# Patient Record
Sex: Female | Born: 2000 | Race: White | Hispanic: No | Marital: Single
Health system: Southern US, Community
[De-identification: ages and names within clinical notes are randomized; demographics above are authoritative.]

## PROBLEM LIST (undated history)

## (undated) ENCOUNTER — Ambulatory Visit

## (undated) HISTORY — PX: NO PAST SURGERIES: SHX2092

---
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
## 2021-05-11 IMAGING — DX DG ANKLE COMPLETE 3+V*L*
3 series · 3 of 3 positions shown · non-contrast
Comparison: None.

CLINICAL DATA: Running, fall, pain in left ankle and foot

EXAM:
LEFT ANKLE COMPLETE - 3+ VIEW

[ankle ap]
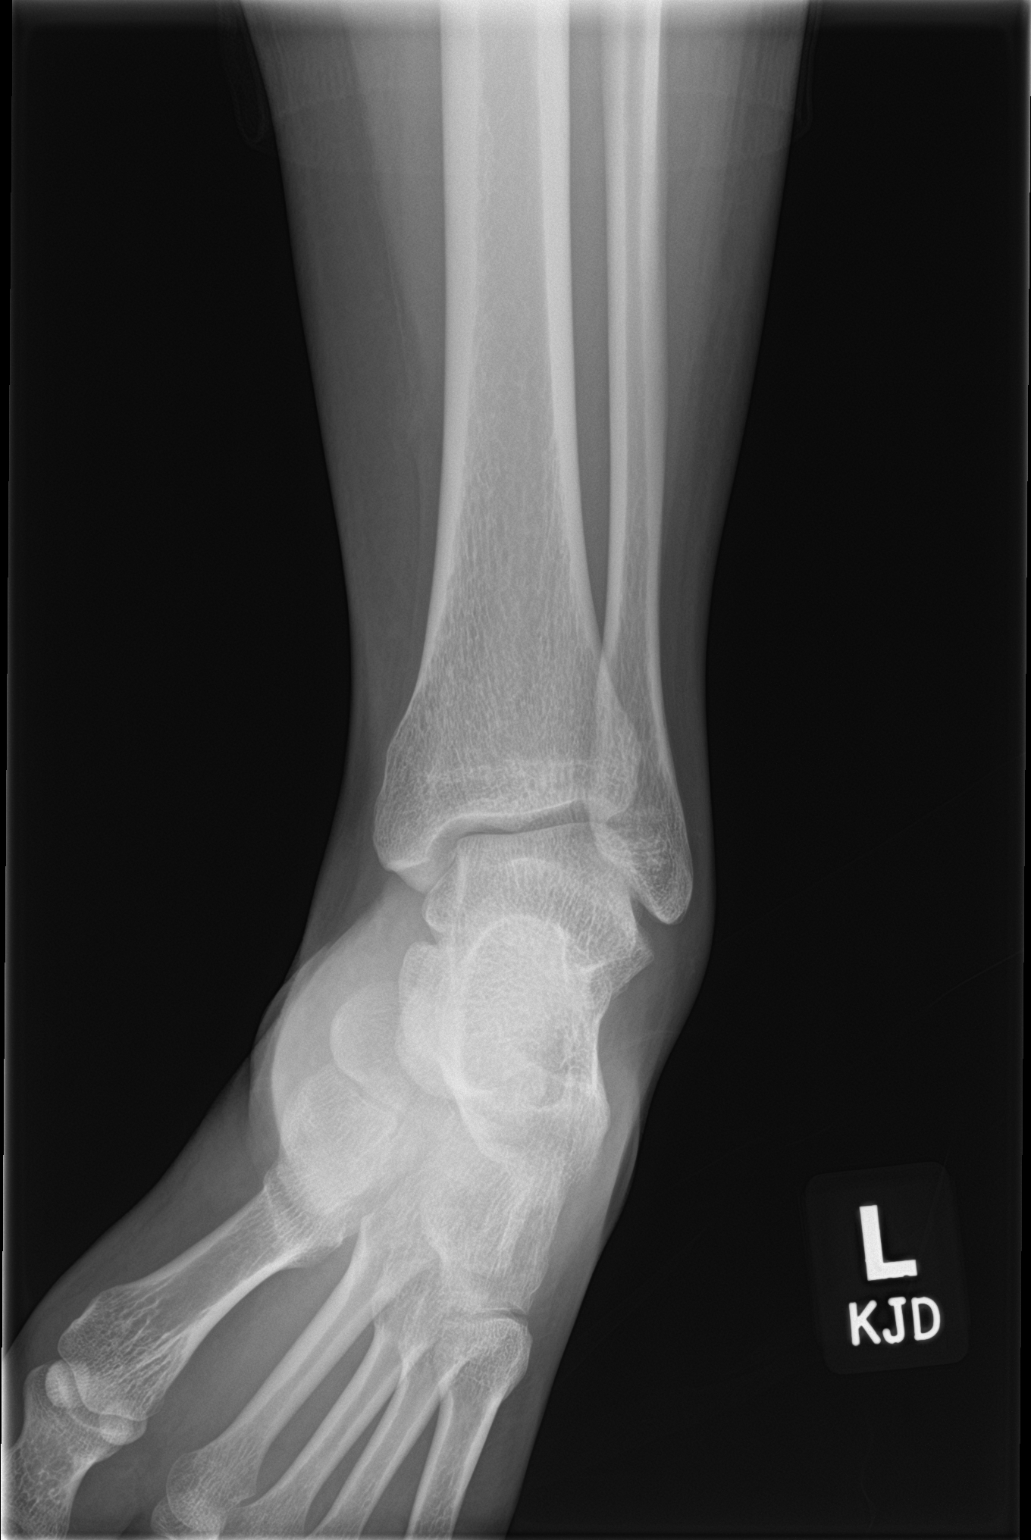

[ankle obl]
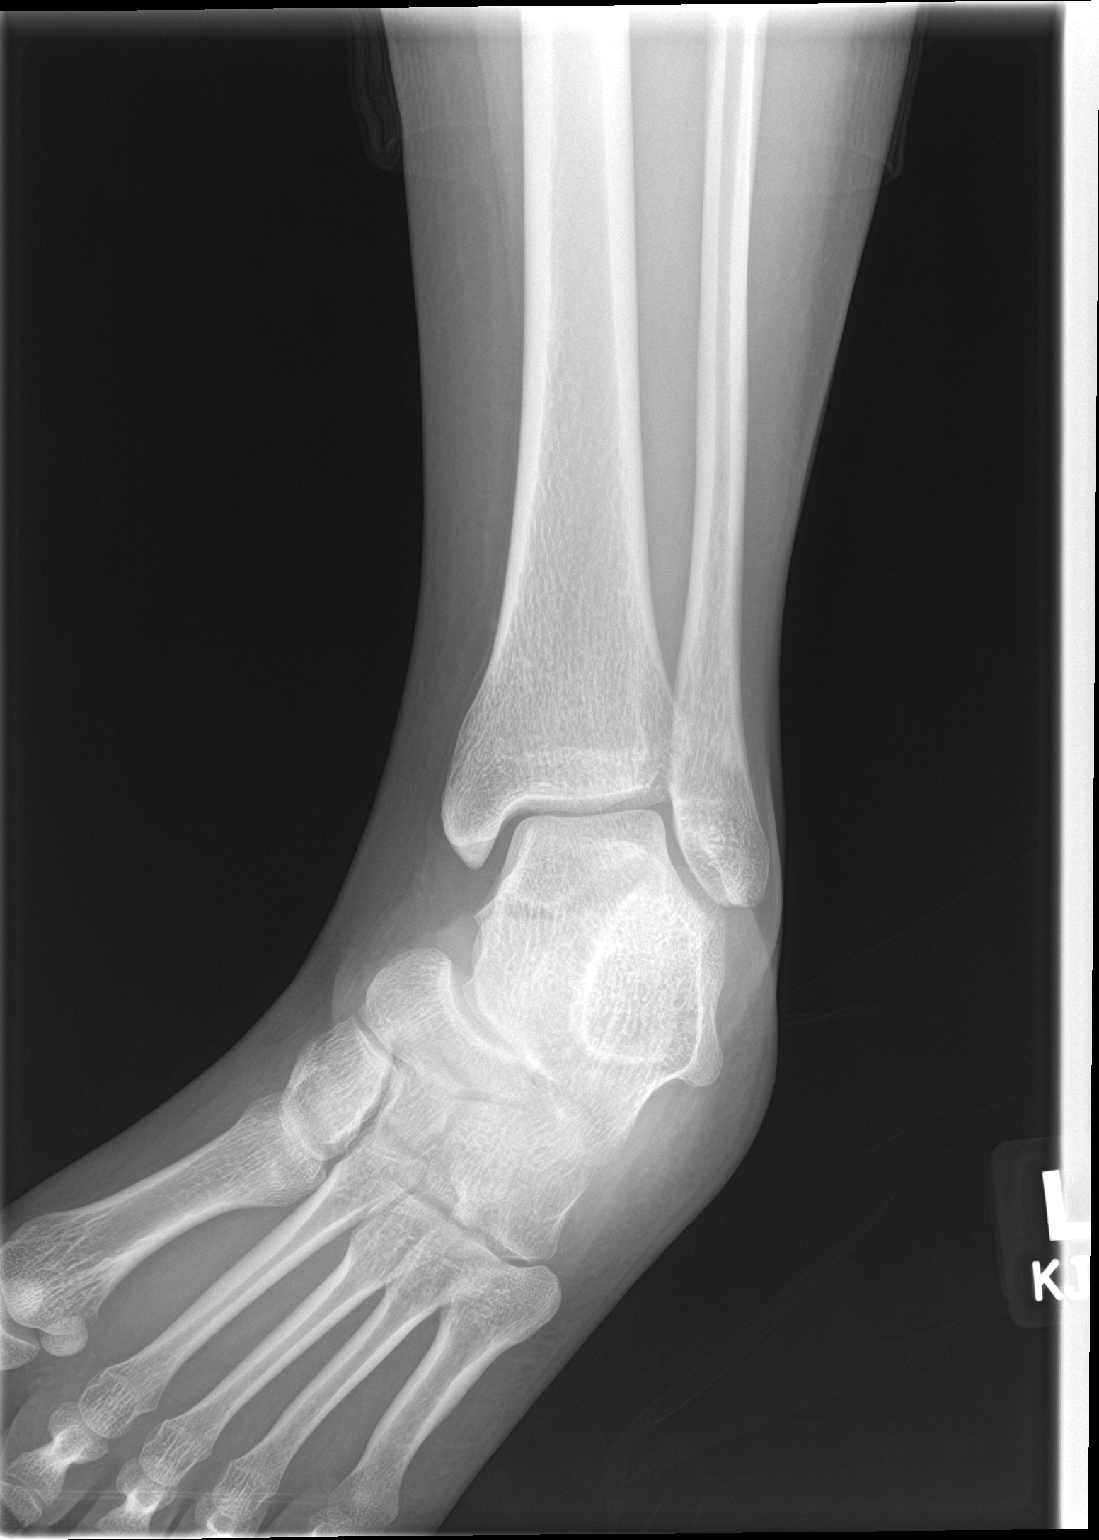

[ankle lat]
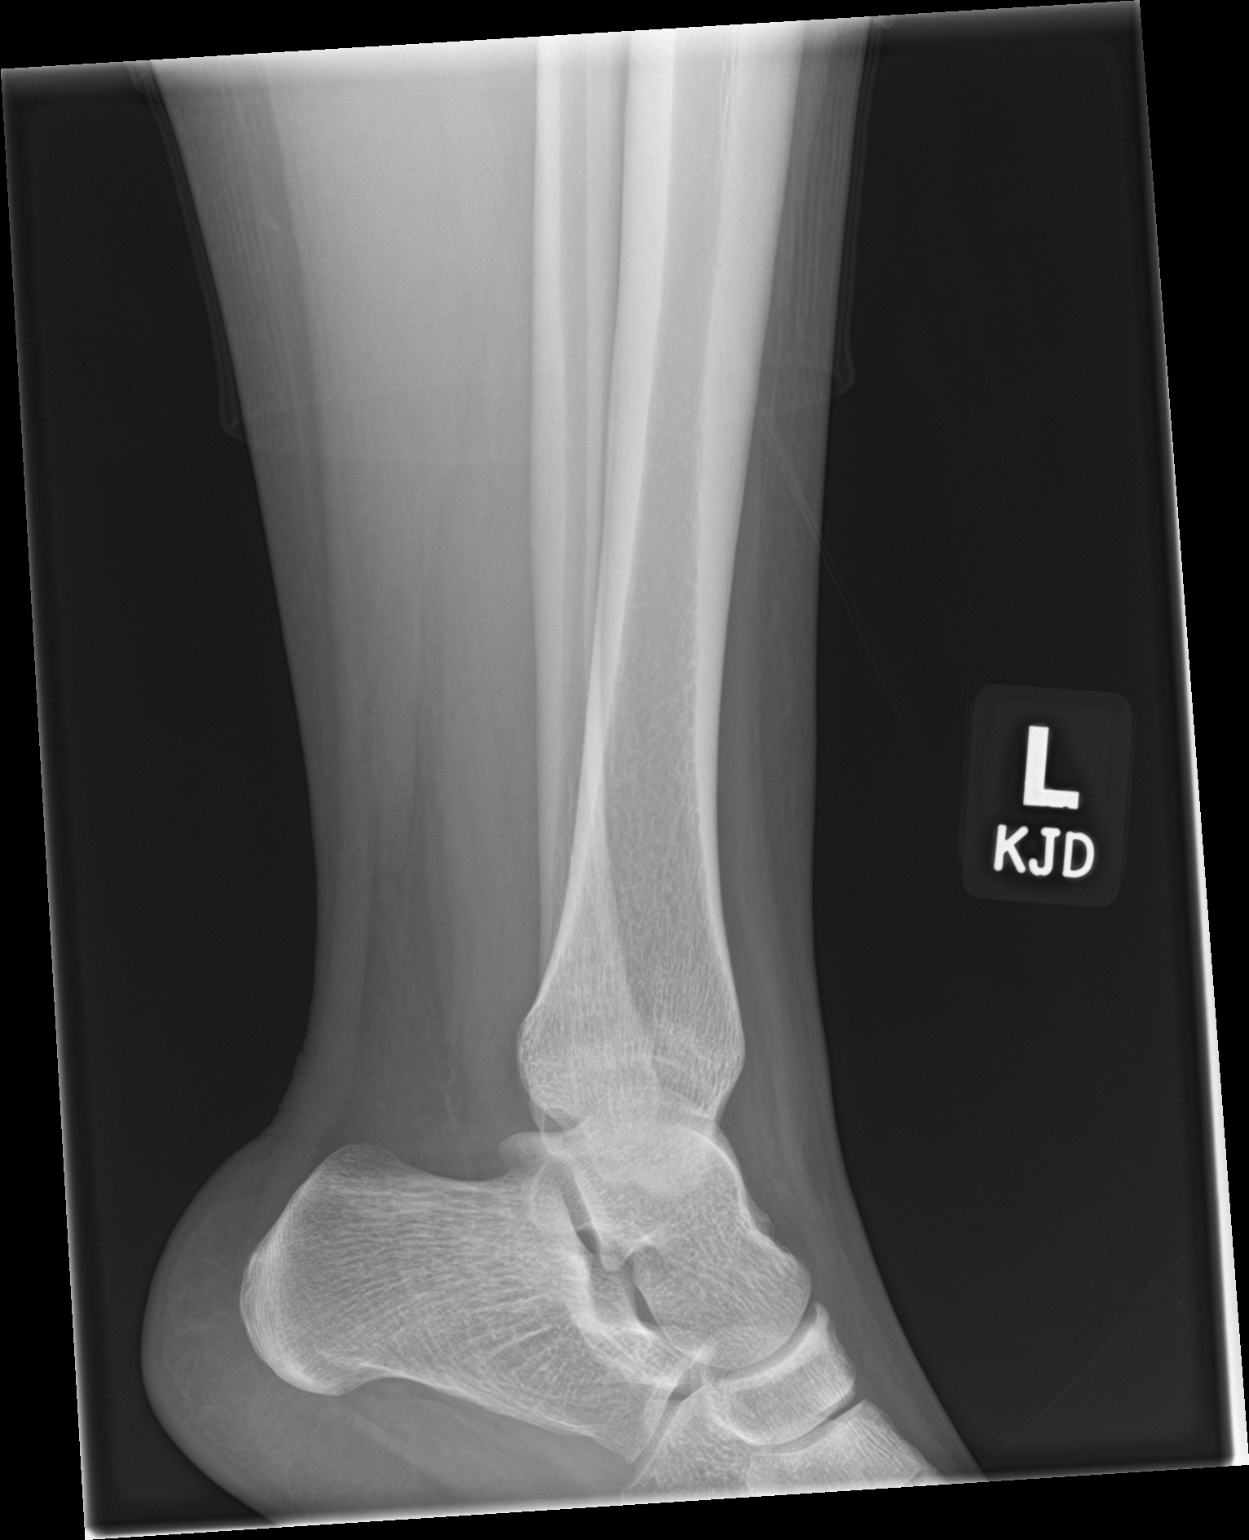

[3 of 3 positions shown; findings below may reference images not displayed]

FINDINGS: There is no evidence of fracture, dislocation, or joint effusion.
There is no evidence of arthropathy or other focal bone abnormality.
Soft tissues are unremarkable.
IMPRESSION: Negative.

## 2022-11-30 ENCOUNTER — Ambulatory Visit: Payer: Medicaid Other | Admitting: Family

## 2022-12-02 ENCOUNTER — Ambulatory Visit: Payer: Medicaid Other | Admitting: Family

## 2022-12-02 ENCOUNTER — Other Ambulatory Visit (HOSPITAL_COMMUNITY)
Admission: RE | Admit: 2022-12-02 | Discharge: 2022-12-02 | Disposition: A | Discharge status: Home / Self Care | Payer: Medicaid Other | Source: Ambulatory Visit | Attending: Family | Admitting: Family

## 2022-12-02 ENCOUNTER — Encounter: Payer: Self-pay | Admitting: Family

## 2022-12-02 VITALS — BP 123/85 | HR 80 | Ht 61.25 in | Wt 221.0 lb

## 2022-12-02 DIAGNOSIS — Z113 Encounter for screening for infections with a predominantly sexual mode of transmission: Secondary | ICD-10-CM | POA: Insufficient documentation

## 2022-12-02 DIAGNOSIS — Z3202 Encounter for pregnancy test, result negative: Secondary | ICD-10-CM | POA: Diagnosis not present

## 2022-12-02 DIAGNOSIS — Z975 Presence of (intrauterine) contraceptive device: Secondary | ICD-10-CM | POA: Diagnosis not present

## 2022-12-02 DIAGNOSIS — R635 Abnormal weight gain: Secondary | ICD-10-CM | POA: Diagnosis not present

## 2022-12-02 LAB — POCT URINE PREGNANCY: Preg Test, Ur: NEGATIVE

## 2022-12-02 NOTE — Progress Notes (Signed)
History was provided by the patient and mother.  Natalie Austin is a 22 y.o. female who is here for nexplanon.   PCP confirmed? Yes.    Salvatore Decent, PA-C   Plan from last visit:  Removal and reinsertion on 11/26/2019  HPI:   -doesn't eat veggies or anything green  -since Nexplanon gained weight; wakes up really hungry  -snacks a little and eats 2 meals daily  -water mostly -is supposed to take vitamin D and multivitamin daily  -was told she is close to prediabetic range; asking about weight and birth control  -would like to keep Nexplanon longer since it may still be effective  -no pain with intercourse, no vaginal discharge changes, no unexplained bleeding; no pelvic or abdominal pain   -mom asking about PAP smear when due  -periods have regulated now on Nexplanon; at first did not have one and now having a period every month  -precontemplative for pills instead of implant; has had implant since she was 17 and notes weight gain since that time  Patient Active Problem List   Diagnosis Date Noted   Abnormal uterine bleeding 11/26/2019   Encounter for surveillance of Nexplanon subdermal contraceptive 01/10/2017    Current Outpatient Medications on File Prior to Visit  Medication Sig Dispense Refill   ibuprofen (ADVIL,MOTRIN) 600 MG tablet Take 1 tablet (600 mg total) by mouth every 6 (six) hours as needed for mild pain or moderate pain. 30 tablet 0   No current facility-administered medications on file prior to visit.    No Known Allergies  Physical Exam:    Vitals:   12/02/22 1053  BP: 123/85  Pulse: 80  Weight: 221 lb (100.2 kg)  Height: 5' 1.25" (1.556 m)   Wt Readings from Last 3 Encounters:  12/02/22 221 lb (100.2 kg)  11/26/19 193 lb (87.5 kg) (97%, Z= 1.87)*  12/01/18 177 lb (80.3 kg) (95%, Z= 1.65)*   * Growth percentiles are based on CDC (Girls, 2-20 Years) data.     Growth %ile SmartLinks can only be used for patients less than 33 years old. No LMP  recorded. Patient has had an implant.  Physical Exam Vitals and nursing note reviewed.  Constitutional:      General: She is not in acute distress.    Appearance: She is well-developed.  Neck:     Thyroid: No thyromegaly.  Cardiovascular:     Rate and Rhythm: Normal rate and regular rhythm.     Heart sounds: No murmur heard. Pulmonary:     Breath sounds: Normal breath sounds.  Musculoskeletal:     Right lower leg: No edema.     Left lower leg: No edema.  Lymphadenopathy:     Cervical: No cervical adenopathy.  Skin:    General: Skin is warm.     Findings: No rash.  Neurological:     General: No focal deficit present.     Mental Status: She is alert and oriented to person, place, and time.     Motor: No tremor.     Comments: No tremor  Psychiatric:        Attention and Perception: Attention normal.        Mood and Affect: Mood normal.        Speech: Speech normal.        Behavior: Behavior normal.      Assessment/Plan:  1. Weight gain 2. Nexplanon in place  -approx 21 lb weight gain since 2021; reviewed that Nexplanon is effective  for up to 5 years now (showed her the recommendations that still remain on Nexplanon website, studies showing longer efficacy, and other recommendations for three vs five year efficacy)  -she is to follow-up with Salvatore Decent, PA in January - discussed option for [facility] for additional support  -I do not see any recent blood work, however she endorsed having done recently; consideration to rule out thyroid, metabolic reasons for weight gain and changes in bleeding pattern  -discussed that we can change out Nexplanon today, change method, or she can continue with current method; she would like to continue at this time with plan for follow-up in 6 months to continue conversation   3. Routine screening for STI (sexually transmitted infection) - Urine cytology ancillary only  4. Pregnancy examination or test, negative result -  POCT urine pregnancy

## 2022-12-03 LAB — URINE CYTOLOGY ANCILLARY ONLY
Bacterial Vaginitis-Urine: POSITIVE — AB
Candida Urine: NEGATIVE
Chlamydia: NEGATIVE
Comment: NEGATIVE
Comment: NEGATIVE
Comment: NORMAL
Neisseria Gonorrhea: NEGATIVE
Trichomonas: NEGATIVE

## 2023-04-07 ENCOUNTER — Encounter: Payer: Medicaid Other | Admitting: Family

## 2023-04-11 ENCOUNTER — Encounter: Payer: Self-pay | Admitting: Family

## 2023-04-11 ENCOUNTER — Ambulatory Visit (INDEPENDENT_AMBULATORY_CARE_PROVIDER_SITE_OTHER): Payer: Medicaid Other | Admitting: Family

## 2023-04-11 VITALS — BP 126/76 | HR 81 | Ht 61.12 in | Wt 218.0 lb

## 2023-04-11 DIAGNOSIS — Z113 Encounter for screening for infections with a predominantly sexual mode of transmission: Secondary | ICD-10-CM

## 2023-04-11 DIAGNOSIS — N921 Excessive and frequent menstruation with irregular cycle: Secondary | ICD-10-CM | POA: Diagnosis not present

## 2023-04-11 DIAGNOSIS — Z975 Presence of (intrauterine) contraceptive device: Secondary | ICD-10-CM

## 2023-04-11 DIAGNOSIS — Z3202 Encounter for pregnancy test, result negative: Secondary | ICD-10-CM

## 2023-04-11 LAB — POCT URINE PREGNANCY: Preg Test, Ur: NEGATIVE

## 2023-04-11 NOTE — Progress Notes (Signed)
 History was provided by the patient and mother.  Natalie Austin is a 23 y.o. female who is here for Nexplanon in place.   PCP confirmed? Yes.    Kathreen Cornfield, PA-C  Plan from last visit:  1. Weight gain 2. Nexplanon in place   -approx 21 lb weight gain since 2021; reviewed that Nexplanon is effective for up to 5 years now (showed her the recommendations that still remain on Nexplanon website, studies showing longer efficacy, and other recommendations for three vs five year efficacy)  -she is to follow-up with Salvatore Decent, PA in January - discussed option for [facility] for additional support  -I do not see any recent blood work, however she endorsed having done recently; consideration to rule out thyroid, metabolic reasons for weight gain and changes in bleeding pattern  -discussed that we can change out Nexplanon today, change method, or she can continue with current method; she would like to continue at this time with plan for follow-up in 6 months to continue conversation    3. Routine screening for STI (sexually transmitted infection) - Urine cytology ancillary only   4. Pregnancy examination or test, negative result   HPI:   -nexplanon in place  -did have period but only lasted a week, started 1/26 and on through 1/31; then started spotting 2/8 -2/12 (wiping and pantyliner only)  -no fishy odor; started taking pills by [facility] because every time she comes here she is told she has BV; pHD wash  -mom says she drinks cranberry juice like water     Patient Active Problem List   Diagnosis Date Noted   Abnormal uterine bleeding 11/26/2019   Encounter for surveillance of Nexplanon subdermal contraceptive 01/10/2017    Current Outpatient Medications on File Prior to Visit  Medication Sig Dispense Refill   ibuprofen (ADVIL,MOTRIN) 600 MG tablet Take 1 tablet (600 mg total) by mouth every 6 (six) hours as needed for mild pain or moderate pain. 30 tablet  0   No current facility-administered medications on file prior to visit.    No Known Allergies  Physical Exam:    Vitals:   04/11/23 1021  BP: 126/76  Pulse: 81  Weight: 218 lb (98.9 kg)  Height: 5' 1.12" (1.552 m)   Wt Readings from Last 3 Encounters:  04/11/23 218 lb (98.9 kg)  12/02/22 221 lb (100.2 kg)  11/26/19 193 lb (87.5 kg) (97%, Z= 1.87)*   * Growth percentiles are based on CDC (Girls, 2-20 Years) data.    Growth %ile SmartLinks can only be used for patients less than 16 years old. No LMP recorded. Patient has had an implant.  Physical Exam Constitutional:      General: She is not in acute distress.    Appearance: She is well-developed.  HENT:     Head: Normocephalic and atraumatic.  Eyes:     General: No scleral icterus.    Pupils: Pupils are equal, round, and reactive to light.  Neck:     Thyroid: No thyromegaly.  Cardiovascular:     Rate and Rhythm: Normal rate and regular rhythm.     Heart sounds: Normal heart sounds. No murmur heard. Pulmonary:     Effort: Pulmonary effort is normal.     Breath sounds: Normal breath sounds.  Musculoskeletal:        General: Normal range of motion.     Cervical back: Normal range of motion and neck supple.  Lymphadenopathy:  Cervical: No cervical adenopathy.  Skin:    General: Skin is warm and dry.     Findings: No rash.     Comments: Implant in place LUE   Neurological:     Mental Status: She is alert and oriented to person, place, and time.     Cranial Nerves: No cranial nerve deficit.  Psychiatric:        Behavior: Behavior normal.        Thought Content: Thought content normal.        Judgment: Judgment normal.      Assessment/Plan:  1. Breakthrough bleeding on Nexplanon (Primary) 2. Nexplanon in place -discussed PAP starts at 22, with PAP + HPV testing at 23 yo -referral to OB/GYN as she will age out of practice next month at birthday  -she elects to return tomorrow for removal/replacement for  Nexplanon  -discussed causes of BV and symptoms; advised that symptoms may return briefly after stopping OTC products for pH balance but should improve over time as pH balance restores; does not have symptoms at this time - Ambulatory referral to Obstetrics / Gynecology  3. Routine screening for STI (sexually transmitted infection) - Urine cytology ancillary only  4. Pregnancy examination or test, negative result - POCT urine pregnancy

## 2023-04-12 ENCOUNTER — Encounter: Payer: Self-pay | Admitting: Family

## 2023-04-12 ENCOUNTER — Ambulatory Visit (INDEPENDENT_AMBULATORY_CARE_PROVIDER_SITE_OTHER): Payer: Self-pay | Admitting: Family

## 2023-04-12 ENCOUNTER — Other Ambulatory Visit (HOSPITAL_COMMUNITY)
Admission: RE | Admit: 2023-04-12 | Discharge: 2023-04-12 | Disposition: A | Discharge status: Home / Self Care | Payer: Medicaid Other | Source: Ambulatory Visit | Attending: Family | Admitting: Family

## 2023-04-12 VITALS — BP 115/76 | HR 80 | Ht 61.12 in | Wt 218.6 lb

## 2023-04-12 DIAGNOSIS — Z113 Encounter for screening for infections with a predominantly sexual mode of transmission: Secondary | ICD-10-CM | POA: Diagnosis present

## 2023-04-12 DIAGNOSIS — Z3046 Encounter for surveillance of implantable subdermal contraceptive: Secondary | ICD-10-CM

## 2023-04-12 DIAGNOSIS — N921 Excessive and frequent menstruation with irregular cycle: Secondary | ICD-10-CM | POA: Diagnosis not present

## 2023-04-12 DIAGNOSIS — Z975 Presence of (intrauterine) contraceptive device: Secondary | ICD-10-CM

## 2023-04-12 MED ORDER — ETONOGESTREL 68 MG ~~LOC~~ IMPL
68.0000 mg | DRUG_IMPLANT | Freq: Once | SUBCUTANEOUS | Status: AC
  Administered 2023-04-12: 68 mg via SUBCUTANEOUS

## 2023-04-12 NOTE — Progress Notes (Signed)
 History was provided by the patient and mother.  Natalie Austin is a 23 y.o. female who is here for nexplanon removal and reinsertion.   PCP confirmed? Yes.    Kathreen Cornfield, PA-C  Plan from last visit:  1. Breakthrough bleeding on Nexplanon (Primary) 2. Nexplanon in place -discussed PAP starts at 22, with PAP + HPV testing at 23 yo -referral to OB/GYN as she will age out of practice next month at birthday  -she elects to return tomorrow for removal/replacement for Nexplanon  -discussed causes of BV and symptoms; advised that symptoms may return briefly after stopping OTC products for pH balance but should improve over time as pH balance restores; does not have symptoms at this time - Ambulatory referral to Obstetrics / Gynecology   3. Routine screening for STI (sexually transmitted infection) - Urine cytology ancillary only   4. Pregnancy examination or test, negative result - POCT urine pregnancy     HPI:   -returns for removal and reinsertion  -no concerns prior to procedure  Patient Active Problem List   Diagnosis Date Noted   Abnormal uterine bleeding 11/26/2019   Encounter for surveillance of Nexplanon subdermal contraceptive 01/10/2017    Current Outpatient Medications on File Prior to Visit  Medication Sig Dispense Refill   ibuprofen (ADVIL,MOTRIN) 600 MG tablet Take 1 tablet (600 mg total) by mouth every 6 (six) hours as needed for mild pain or moderate pain. 30 tablet 0   No current facility-administered medications on file prior to visit.    No Known Allergies  Physical Exam:    Vitals:   04/12/23 1051  BP: 115/76  Pulse: 80  Weight: 218 lb 9.6 oz (99.2 kg)  Height: 5' 1.12" (1.552 m)    Growth %ile SmartLinks can only be used for patients less than 21 years old. No LMP recorded. Patient has had an implant.  Physical Exam Constitutional:      General: She is not in acute distress.    Appearance: She is well-developed.  HENT:     Head:  Normocephalic and atraumatic.  Eyes:     General: No scleral icterus.    Pupils: Pupils are equal, round, and reactive to light.  Neck:     Thyroid: No thyromegaly.  Cardiovascular:     Rate and Rhythm: Normal rate and regular rhythm.     Heart sounds: Normal heart sounds. No murmur heard. Pulmonary:     Effort: Pulmonary effort is normal.     Breath sounds: Normal breath sounds.  Musculoskeletal:        General: Normal range of motion.     Cervical back: Normal range of motion and neck supple.  Lymphadenopathy:     Cervical: No cervical adenopathy.  Skin:    General: Skin is warm and dry.     Findings: No rash.     Comments: Implant palpable in LUE   Neurological:     Mental Status: She is alert and oriented to person, place, and time.     Cranial Nerves: No cranial nerve deficit.  Psychiatric:        Behavior: Behavior normal.        Thought Content: Thought content normal.        Judgment: Judgment normal.      Assessment/Plan:  1. Breakthrough bleeding on Nexplanon (Primary) 2. Encounter for removal and reinsertion of Nexplanon Risks & benefits of Nexplanon removal discussed. Consent form signed.  The patient denies any allergies to anesthetics or antiseptics.  Procedures: Removal:  Pt was placed in supine position. left arm was flexed at the elbow and externally rotated so that her wrist was parallel to her ear, The device was palpated and marked. The site was cleaned with Betadine. The area surrounding the device was covered with a sterile drape. 1% lidocaine was injected just under the device. A scalpel was used to create a small incision. The device was pushed towards the incision. Fibrous tissue surrounding the device was gradually removed from the device. The device was removed and measured to ensure all 4 cm of device was removed. Insertion:  1% lidocaine was injected just under the skin at the insertion site extending 4 cm proximally. The sterile  preloaded disposable Nexaplanon applicator was removed from the sterile packaging The applicator needle was inserted at a 30 degree angle at 8 cm proximal to the medial epicondyle as marked The applicator was lowered to a horizontal position and advanced just under the skin for the full length of the needle The slider on the applicator was retracted fully while the applicator remained in the same position, then the applicator was removed. The implant was confirmed via palpation as being in position The implant position was demonstrated to the patient Pressure dressing was applied to the patient.  The patient was instructed to removed the pressure dressing in 24 hrs.  The patient was advised to move slowly from a supine to an upright position  The patient denied any concerns or complaints  The patient was instructed to schedule a follow-up appt in 1 month and to call sooner if any concerns.  The patient acknowledged agreement and understanding of the plan.   - Subdermal Etonogestrel Implant Insertion - etonogestrel (NEXPLANON) implant 68 mg   Lot Number [id number] 04/12/23 1240  Expiration Date   12/11/23 04/12/23 1240  NDC 14782-956-21

## 2023-04-13 LAB — URINE CYTOLOGY ANCILLARY ONLY
Bacterial Vaginitis-Urine: NEGATIVE
Candida Urine: POSITIVE — AB
Chlamydia: NEGATIVE
Comment: NEGATIVE
Comment: NEGATIVE
Comment: NORMAL
Neisseria Gonorrhea: NEGATIVE
Trichomonas: NEGATIVE

## 2023-10-06 ENCOUNTER — Ambulatory Visit
Admission: EM | Admit: 2023-10-06 | Discharge: 2023-10-06 | Disposition: A | Discharge status: Home / Self Care | Attending: Family Medicine | Admitting: Family Medicine

## 2023-10-06 DIAGNOSIS — S39012A Strain of muscle, fascia and tendon of lower back, initial encounter: Secondary | ICD-10-CM

## 2023-10-06 DIAGNOSIS — M5441 Lumbago with sciatica, right side: Secondary | ICD-10-CM

## 2023-10-06 DIAGNOSIS — M5442 Lumbago with sciatica, left side: Secondary | ICD-10-CM | POA: Diagnosis not present

## 2023-10-06 MED ORDER — CYCLOBENZAPRINE HCL 5 MG PO TABS: 5.0000 mg | ORAL_TABLET | Freq: Every evening | ORAL | 0 refills | Status: AC | PRN

## 2023-10-06 MED ORDER — PREDNISONE 20 MG PO TABS: ORAL_TABLET | ORAL | 0 refills | Status: AC

## 2023-10-06 NOTE — ED Triage Notes (Signed)
 Pt c/o lower back pain started yesterday when I was trying to crack my back-took advil  last night-no pain meds today-slow gait to tx room

## 2023-10-06 NOTE — ED Provider Notes (Signed)
 [facility]  Note:  This document was prepared using Conservation officer, historic buildings and may include unintentional dictation errors.  MRN: [id number] DOB: September 06, 2000  Subjective:   Natalie Austin is a 23 y.o. female presenting for 1 day history of severe low back pain not responding to naproxen.  Patient reports that symptoms started after she was trying to pop her back using twisting motions.  She subsequently went to place her small dog and there crate and as she bent at the level of her waist felt sudden sharp pain across her low back.  It is now radiating into her legs.  No changes to bowel or urinary habits, fall, trauma, weakness, numbness or tingling.  No history of musculoskeletal disorders.  No current facility-administered medications for this encounter.  Current Outpatient Medications:    ibuprofen  (ADVIL ,MOTRIN ) 600 MG tablet, Take 1 tablet (600 mg total) by mouth every 6 (six) hours as needed for mild pain or moderate pain., Disp: 30 tablet, Rfl: 0   No Known Allergies  No past medical history on file.   Past Surgical History:  Procedure Laterality Date   NO PAST SURGERIES      Family History  Problem Relation Age of Onset   Hypertension Maternal Grandmother    Diabetes Maternal Grandmother     Social History   Tobacco Use   Smoking status: Never    Passive exposure: Never   Smokeless tobacco: Never  Substance Use Topics   Alcohol use: Never   Drug use: Never    ROS   Objective:   Vitals: There were no vitals taken for this visit.  Physical Exam Constitutional:      General: She is not in acute distress.    Appearance: Normal appearance. She is well-developed. She is not ill-appearing, toxic-appearing or diaphoretic.  HENT:     Head: Normocephalic and atraumatic.     Nose: Nose normal.     Mouth/Throat:     Mouth: Mucous membranes are moist.  Eyes:     General: No scleral icterus.       Right eye: No discharge.         Left eye: No discharge.     Extraocular Movements: Extraocular movements intact.  Cardiovascular:     Rate and Rhythm: Normal rate.  Pulmonary:     Effort: Pulmonary effort is normal.  Musculoskeletal:     Lumbar back: Spasms and tenderness present. No swelling, edema, deformity, signs of trauma, lacerations or bony tenderness. Normal range of motion. Negative right straight leg raise test and negative left straight leg raise test. No scoliosis.  Skin:    General: Skin is warm and dry.  Neurological:     General: No focal deficit present.     Mental Status: She is alert and oriented to person, place, and time.  Psychiatric:        Mood and Affect: Mood normal.        Behavior: Behavior normal.     Assessment and Plan :   PDMP not reviewed this encounter.  1. Lumbar strain, initial encounter   2. Acute bilateral low back pain with bilateral sciatica    Suspect lumbar strain that is complicating to bilateral low back pain with sciatica.  Patient declined NSAID trial.  Will offer oral prednisone , muscle relaxant.  Discussed back care.  Follow-up with [facility]. Counseled patient on potential for adverse effects with medications prescribed/recommended today, ER and return-to-clinic precautions discussed, patient verbalized understanding.  Christopher Savannah, PA-C 10/06/23 1416
